# Patient Record
Sex: Female | Born: 1980 | Race: White | Hispanic: No | Marital: Married | Smoking: Never smoker
Health system: Southern US, Community
[De-identification: ages and names within clinical notes are randomized; demographics above are authoritative.]

---

## 2017-08-16 ENCOUNTER — Emergency Department (HOSPITAL_COMMUNITY)
Admission: EM | Admit: 2017-08-16 | Discharge: 2017-08-17 | Disposition: A | Discharge status: Home / Self Care | Payer: 59 | Attending: Emergency Medicine | Admitting: Emergency Medicine

## 2017-08-16 ENCOUNTER — Emergency Department (HOSPITAL_COMMUNITY): Payer: 59

## 2017-08-16 ENCOUNTER — Encounter (HOSPITAL_COMMUNITY): Payer: Self-pay | Admitting: Emergency Medicine

## 2017-08-16 DIAGNOSIS — F419 Anxiety disorder, unspecified: Secondary | ICD-10-CM | POA: Diagnosis not present

## 2017-08-16 DIAGNOSIS — R079 Chest pain, unspecified: Secondary | ICD-10-CM | POA: Diagnosis not present

## 2017-08-16 LAB — CBC
HEMATOCRIT: 40.7 % (ref 36.0–46.0)
HEMOGLOBIN: 13.1 g/dL (ref 12.0–15.0)
MCH: 29 pg (ref 26.0–34.0)
MCHC: 32.2 g/dL (ref 30.0–36.0)
MCV: 90.2 fL (ref 78.0–100.0)
Platelets: 219 10*3/uL (ref 150–400)
RBC: 4.51 MIL/uL (ref 3.87–5.11)
RDW: 11.9 % (ref 11.5–15.5)
WBC: 5.4 10*3/uL (ref 4.0–10.5)

## 2017-08-16 LAB — I-STAT BETA HCG BLOOD, ED (MC, WL, AP ONLY): I-stat hCG, quantitative: <5 m[IU]/mL (ref <5)

## 2017-08-16 LAB — BASIC METABOLIC PANEL
ANION GAP: 11 (ref 5–15)
BUN: 9 mg/dL (ref 6–20)
CHLORIDE: 100 mmol/L — AB (ref 101–111)
CO2: 27 mmol/L (ref 22–32)
Calcium: 9.6 mg/dL (ref 8.9–10.3)
Creatinine, Ser: 0.77 mg/dL (ref 0.44–1.00)
GFR calc Af Amer: >60 mL/min (ref >60)
GFR calc non Af Amer: >60 mL/min (ref >60)
GLUCOSE: 118 mg/dL — AB (ref 65–99)
POTASSIUM: 3.8 mmol/L (ref 3.5–5.1)
Sodium: 138 mmol/L (ref 135–145)

## 2017-08-16 LAB — I-STAT TROPONIN, ED: Troponin i, poc: 0.01 ng/mL (ref 0.00–0.08)

## 2017-08-16 NOTE — ED Triage Notes (Signed)
Pt presents to ED for assessment of left sided chest pain with related anxiety x 1 week.  Pt sts hx of anxiety, but not to this extent.  Patient states constant state of panic for 1 week.  Pt c/o SOB, nausea, and dizziness.

## 2017-08-17 MED ORDER — LORAZEPAM 1 MG PO TABS
0.5000 mg | ORAL_TABLET | Freq: Once | ORAL | Status: AC
  Administered 2017-08-17: 0.5 mg via ORAL
  Filled 2017-08-17: qty 1

## 2017-08-17 MED ORDER — LORAZEPAM 0.5 MG PO TABS: 0.5000 mg | ORAL_TABLET | Freq: Three times a day (TID) | ORAL | 0 refills | Status: AC | PRN

## 2017-08-17 NOTE — ED Provider Notes (Signed)
[facility] EMERGENCY DEPARTMENT Provider Note   CSN: [id number] Arrival date & time: 08/16/17  2223     History   Chief Complaint Chief Complaint  Patient presents with  . Anxiety  . Chest Pain    HPI Debra Morris is a 37 y.o. female.   37 year old female presents to the emergency department for left sided chest pain.  She states that she has had fairly persistent chest pain over the past week.  She describes it as a sharp pain which is nonradiating.  She has also had increased anxiety, but is unsure of any provoking factors of this anxious state.  She reports a family history of anxiety in her mother and sister.  Symptoms have been associated with shortness of breath, nausea, dizziness.  She has not taken any medications for her symptoms.  No recent fevers, syncope, surgeries, hospitalizations, leg swelling, prolonged travel.  She denies a family history of sudden cardiac death at a young age.  No history of ACS; father has a history of atrial fibrillation and CAD.  The history is provided by the patient. No language interpreter was used.  Anxiety  Associated symptoms include chest pain.  Chest Pain      History reviewed. No pertinent past medical history.  There are no active problems to display for this patient.   History reviewed. No pertinent surgical history.   OB History   None      Home Medications    Prior to Admission medications   Medication Sig Start Date End Date Taking? Authorizing Provider  LORazepam (ATIVAN) 0.5 MG tablet Take 1 tablet (0.5 mg total) by mouth every 8 (eight) hours as needed for anxiety. 08/17/17   Antony Madura, PA-C    Family History History reviewed. No pertinent family history.  Social History Social History   Tobacco Use  . Smoking status: Never Smoker  . Smokeless tobacco: Never Used  Substance Use Topics  . Alcohol use: Not Currently  . Drug use: Never     Allergies   Penicillins   Review of  Systems Review of Systems  Cardiovascular: Positive for chest pain.  Ten systems reviewed and are negative for acute change, except as noted in the HPI.    Physical Exam Updated Vital Signs BP 115/68   Pulse 67   Temp 97.8 F (36.6 C) (Oral)   Resp 12   Ht 5\' 9"  (1.753 m)   Wt 70.3 kg (155 lb)   LMP 08/12/2017   SpO2 100%   BMI 22.89 kg/m   Physical Exam  Constitutional: She is oriented to person, place, and time. She appears well-developed and well-nourished. No distress.  Nontoxic appearing and in NAD  HENT:  Head: Normocephalic and atraumatic.  Eyes: Conjunctivae and EOM are normal. No scleral icterus.  Neck: Normal range of motion.  Cardiovascular: Normal rate, regular rhythm and intact distal pulses.  Pulmonary/Chest: Effort normal. No stridor. No respiratory distress. She has no wheezes. She has no rales.  Lungs CTAB  Abdominal: Soft. She exhibits no distension and no mass. There is no tenderness. There is no guarding.  Soft, nontender  Musculoskeletal: Normal range of motion.  Neurological: She is alert and oriented to person, place, and time. She exhibits normal muscle tone. Coordination normal.  GCS 15. Moving all extremities.  Skin: Skin is warm and dry. No rash noted. She is not diaphoretic. No erythema. No pallor.  Psychiatric: Her behavior is normal. Her mood appears anxious.  Nursing note  and vitals reviewed.    ED Treatments / Results  Labs (all labs ordered are listed, but only abnormal results are displayed) Labs Reviewed  BASIC METABOLIC PANEL - Abnormal; Notable for the following components:      Result Value   Chloride 100 (*)    Glucose, Bld 118 (*)    All other components within normal limits  CBC  I-STAT TROPONIN, ED  I-STAT BETA HCG BLOOD, ED ([facility], [facility], [facility] ONLY)    EKG EKG Interpretation  Date/Time:  Sunday August 16 2017 22:35:05 EDT Ventricular Rate:  76 PR Interval:  102 QRS Duration: 80 QT Interval:  366 QTC Calculation: 411 R  Axis:   78 Text Interpretation:  Sinus rhythm with short PR Otherwise normal ECG No previous ECGs available Confirmed by Wickline, Donald ([id number]) on 08/17/2017 2:12:15 AM   Radiology Dg Chest 2 View  Result Date: 08/16/2017 CLINICAL DATA:  Left-sided chest pain with anxiety x1 week. EXAM: CHEST - 2 VIEW COMPARISON:  None. FINDINGS: The heart size and mediastinal contours are within normal limits. Both lungs are clear. The visualized skeletal structures are unremarkable. IMPRESSION: No active cardiopulmonary disease. Electronically Signed   By: David  Kwon M.D.   On: 08/16/2017 23:26    Procedures Procedures (including critical care time)  Medications Ordered in ED Medications  LORazepam (ATIVAN) tablet 0.5 mg (has no administration in time range)     Initial Impression / Assessment and Plan / ED Course  I have reviewed the triage vital signs and the nursing notes.  Pertinent labs & imaging results that were available during my care of the patient were reviewed by me and considered in my medical decision making (see chart for details).     Patient presents to the emergency department for evaluation of chest pain.  Low suspicion for cardiac etiology given reassuring workup today.  EKG is nonischemic and troponin negative.  Heart score consistent with low risk of acute coronary event.  Chest x-ray without evidence of mediastinal widening to suggest dissection.  No pneumothorax, pneumonia, pleural effusion.  Pulmonary embolus further considered; however, patient without tachycardia, tachypnea, dyspnea, hypoxia.  Patient is PERC negative.  Suspect symptoms to be secondary to ongoing anxiety.  Have counseled the patient on the need for primary care follow-up.  Will give short course of Ativan for as needed use.  Return precautions discussed and provided. Patient discharged in stable condition with no unaddressed concerns.   Vitals:   08/17/17 0300 08/17/17 0315 08/17/17 0330 08/17/17 0345    BP: 118/62 122/72 (!) 119/59 115/68  Pulse: 65 73 62 67  Resp: 19 12 15 12  Temp:      TempSrc:      SpO2: 98% 100% 96% 100%  Weight:      Height:        Final Clinical Impressions(s) / ED Diagnoses   Final diagnoses:  Acute anxiety  Chest pain, unspecified type    ED Discharge Orders        Ordered    LORazepam (ATIVAN) 0.5 MG tablet  Every 8 hours PRN     06 /10/19 0337       Antony MaduraHumes, Lathaniel Legate, PA-C 08/17/17 0402    Zadie RhineWickline, Donald, MD 08/17/17 [phone]

## 2018-10-27 IMAGING — DX DG CHEST 2V
2 series · 2 of 2 positions shown · non-contrast
Comparison: None.

CLINICAL DATA: Left-sided chest pain with anxiety x1 week.

EXAM:
CHEST - 2 VIEW

[chest pa]
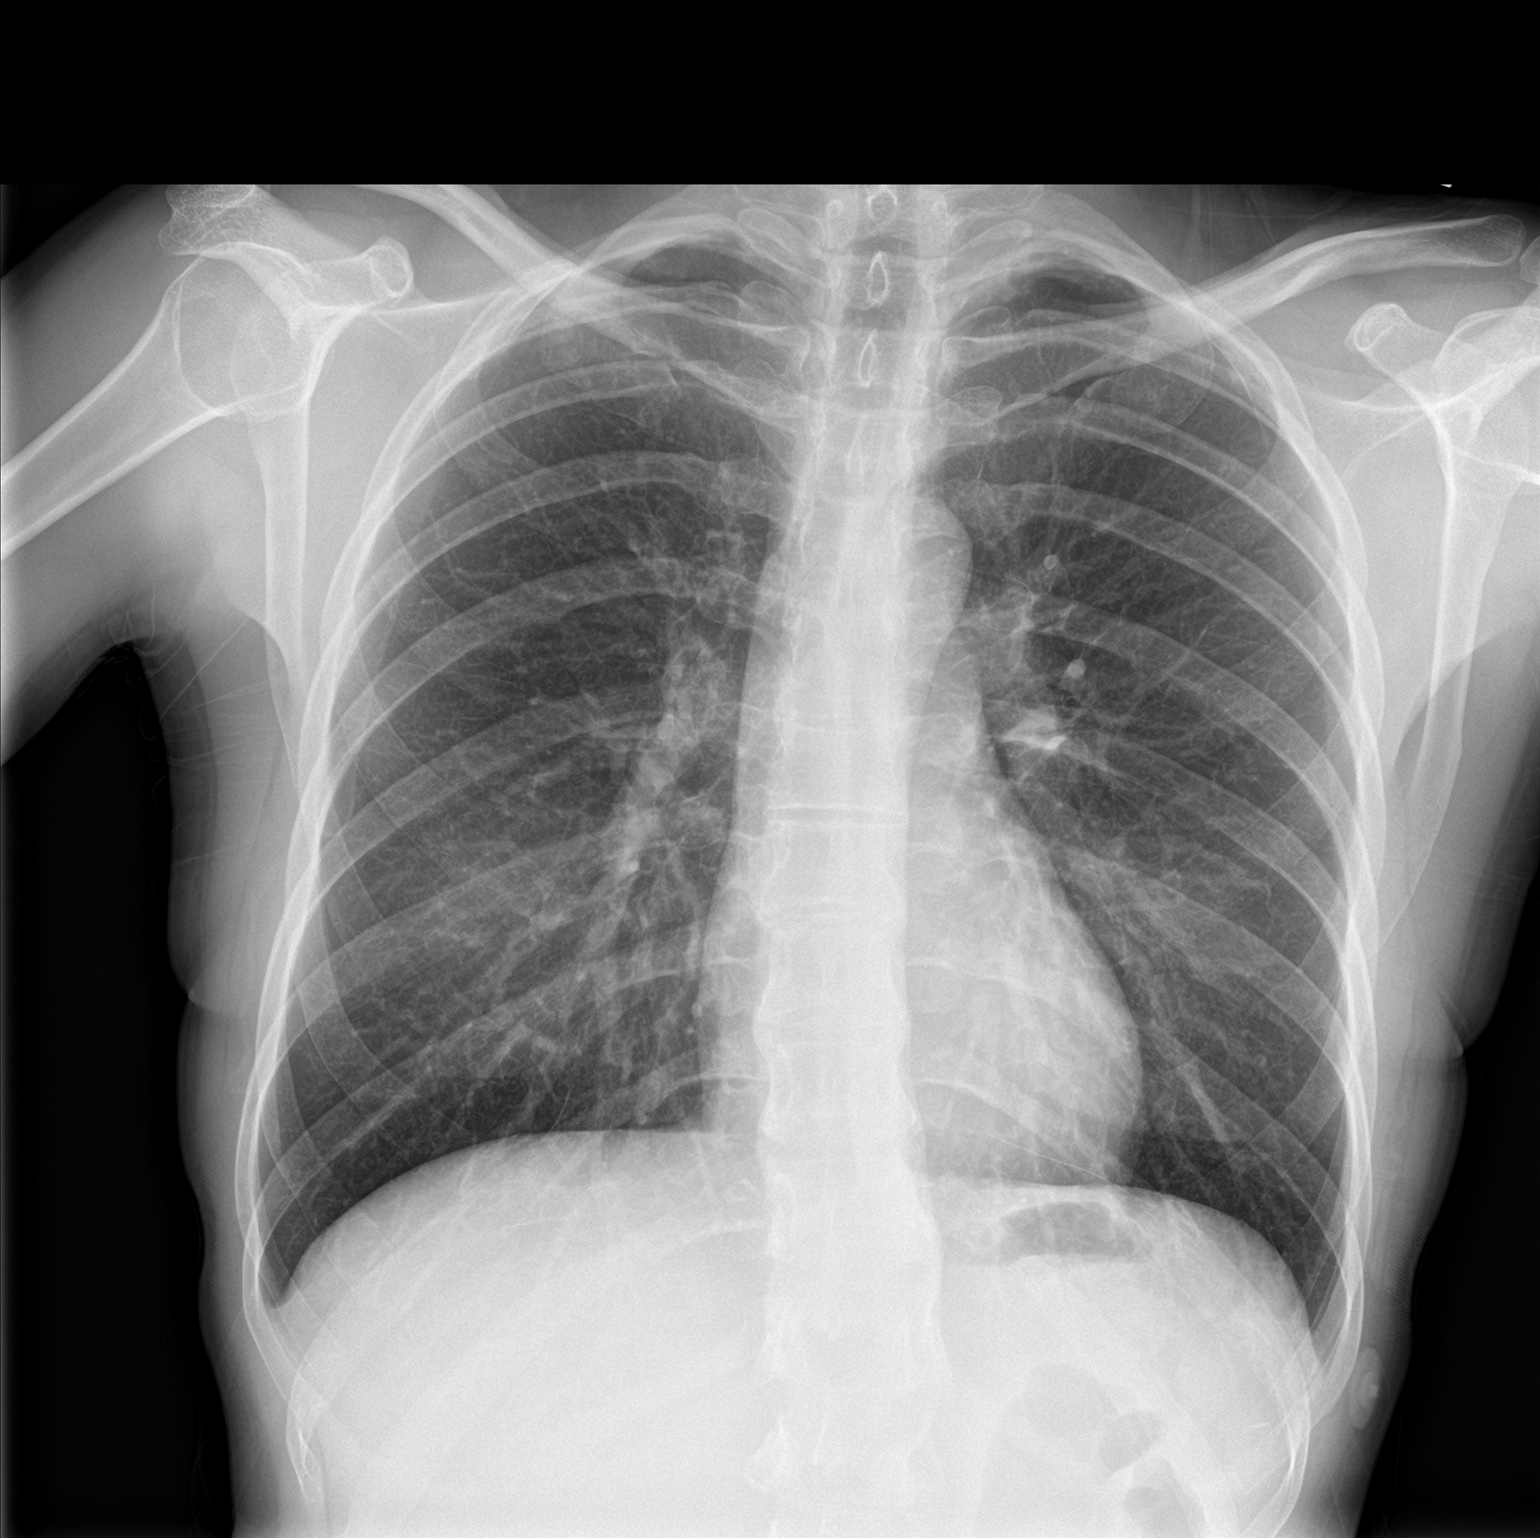

[chest lat]
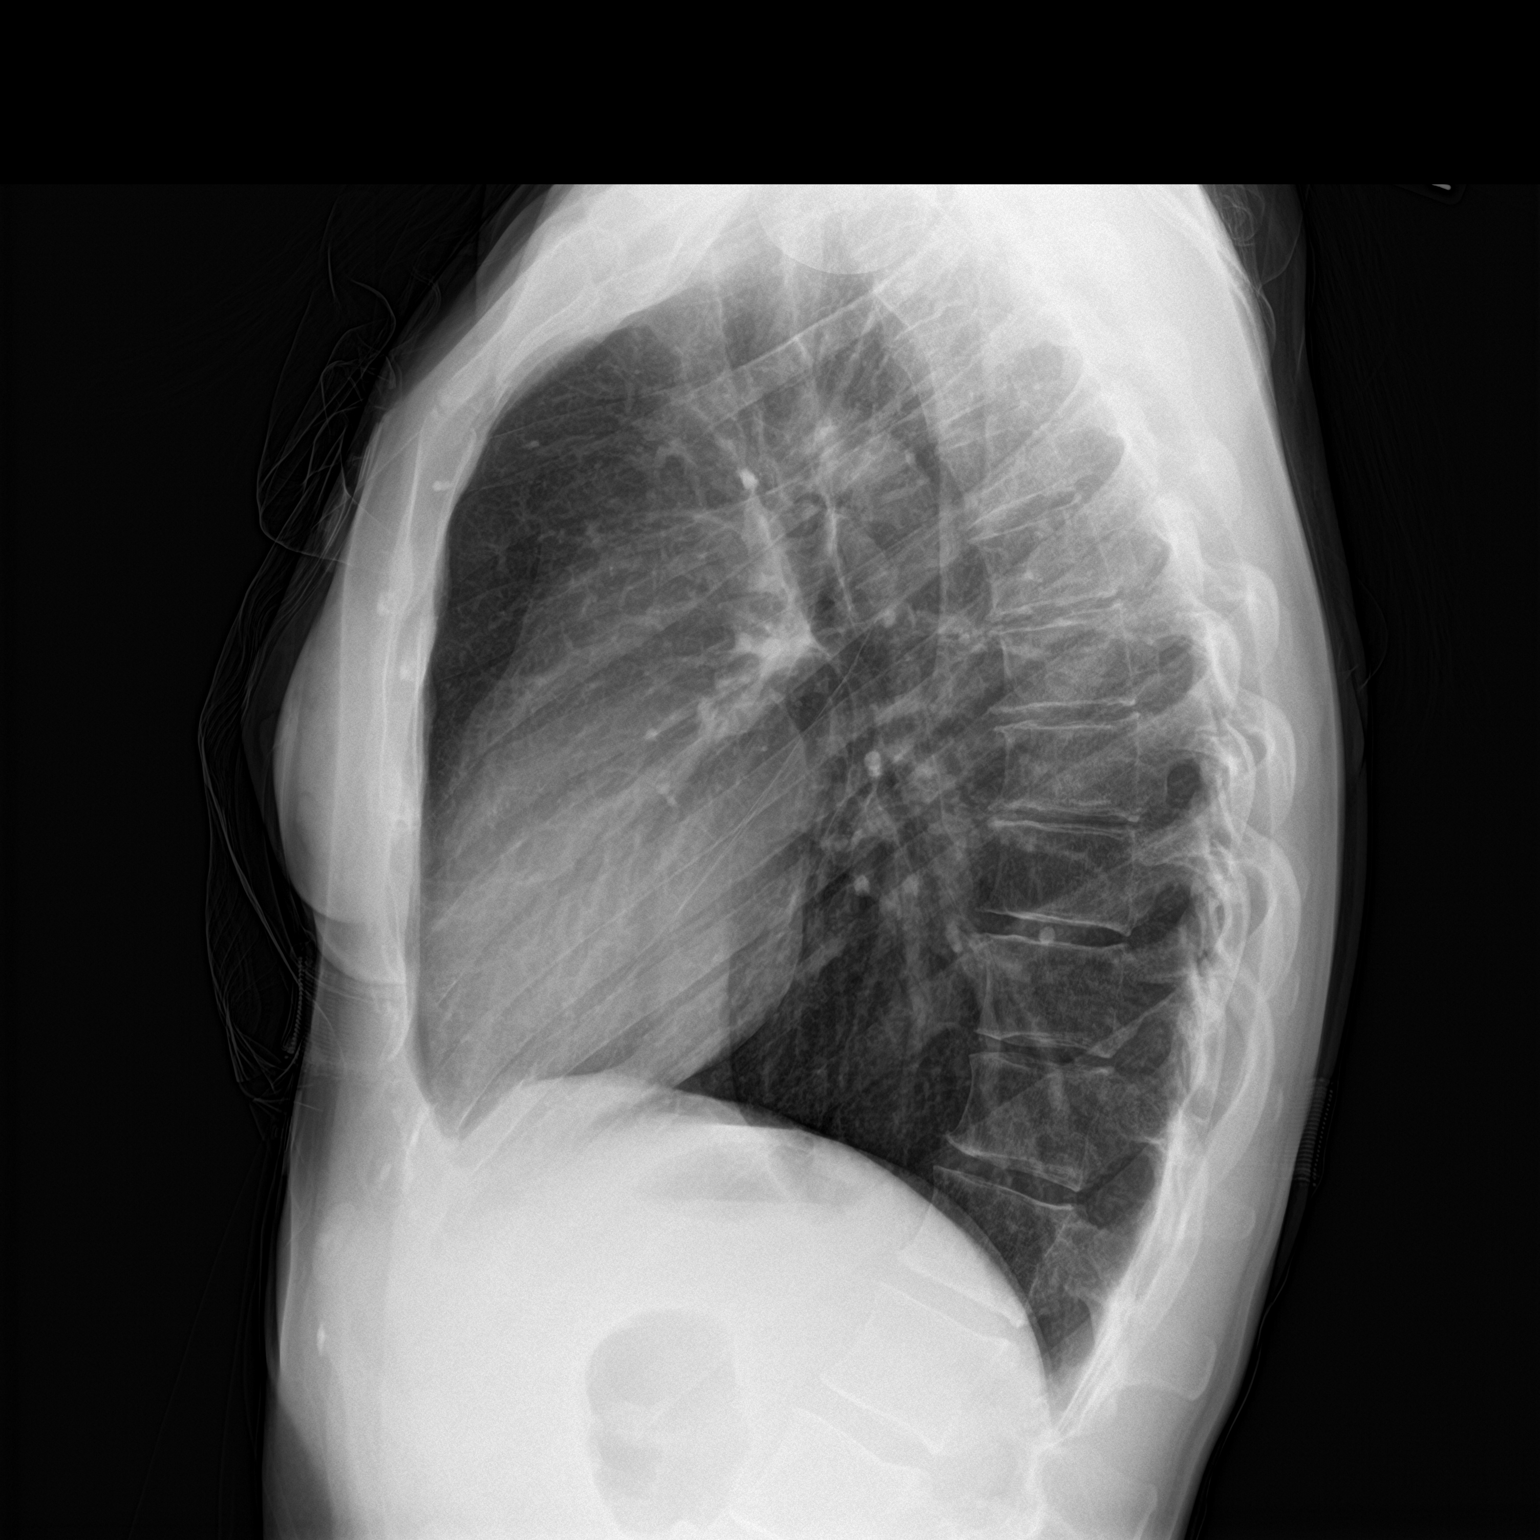

[2 of 2 positions shown; findings below may reference images not displayed]

FINDINGS: The heart size and mediastinal contours are within normal limits.
Both lungs are clear. The visualized skeletal structures are
unremarkable.
IMPRESSION: No active cardiopulmonary disease.
